# Patient Record
Sex: Female | Born: 1985 | Race: White | Hispanic: No | Marital: Single | Smoking: Never smoker
Health system: Southern US, Community
[De-identification: ages and names within clinical notes are randomized; demographics above are authoritative.]

## PROBLEM LIST (undated history)

## (undated) HISTORY — PX: TONSILLECTOMY: SUR1361

---

## 2010-02-02 ENCOUNTER — Encounter: Payer: Self-pay | Admitting: Family Medicine

## 2010-07-23 NOTE — Assessment & Plan Note (Signed)
Summary: BACK INJURY DUE TO FALL/TJ   Assessment PATIENT PROCEEDED TO ER  The patient and/or caregiver has been counseled thoroughly with regard to medications prescribed including dosage, schedule, interactions, rationale for use, and possible side effects and they verbalize understanding.  Diagnoses and expected course of recovery discussed and will return if not improved as expected or if the condition worsens. Patient and/or caregiver verbalized understanding.

## 2013-12-06 ENCOUNTER — Encounter: Payer: Self-pay | Admitting: Obstetrics & Gynecology

## 2013-12-06 DIAGNOSIS — Z01419 Encounter for gynecological examination (general) (routine) without abnormal findings: Secondary | ICD-10-CM

## 2014-02-21 ENCOUNTER — Encounter: Payer: Self-pay | Admitting: Obstetrics & Gynecology

## 2014-02-28 ENCOUNTER — Encounter: Payer: Self-pay | Admitting: Obstetrics & Gynecology

## 2014-02-28 DIAGNOSIS — Z01419 Encounter for gynecological examination (general) (routine) without abnormal findings: Secondary | ICD-10-CM

## 2014-04-10 ENCOUNTER — Encounter: Payer: Self-pay | Admitting: Obstetrics & Gynecology

## 2014-04-10 DIAGNOSIS — Z01419 Encounter for gynecological examination (general) (routine) without abnormal findings: Secondary | ICD-10-CM

## 2016-10-01 ENCOUNTER — Encounter (HOSPITAL_COMMUNITY): Payer: Self-pay

## 2016-10-01 ENCOUNTER — Emergency Department (HOSPITAL_COMMUNITY)
Admission: EM | Admit: 2016-10-01 | Discharge: 2016-10-01 | Disposition: A | Discharge status: Home / Self Care | Payer: Worker's Compensation | Attending: Emergency Medicine | Admitting: Emergency Medicine

## 2016-10-01 ENCOUNTER — Emergency Department (HOSPITAL_COMMUNITY): Payer: Worker's Compensation

## 2016-10-01 DIAGNOSIS — Y9389 Activity, other specified: Secondary | ICD-10-CM | POA: Insufficient documentation

## 2016-10-01 DIAGNOSIS — W1839XA Other fall on same level, initial encounter: Secondary | ICD-10-CM | POA: Insufficient documentation

## 2016-10-01 DIAGNOSIS — S6992XA Unspecified injury of left wrist, hand and finger(s), initial encounter: Secondary | ICD-10-CM

## 2016-10-01 DIAGNOSIS — Y9289 Other specified places as the place of occurrence of the external cause: Secondary | ICD-10-CM | POA: Insufficient documentation

## 2016-10-01 DIAGNOSIS — Y990 Civilian activity done for income or pay: Secondary | ICD-10-CM | POA: Insufficient documentation

## 2016-10-01 DIAGNOSIS — S60222A Contusion of left hand, initial encounter: Secondary | ICD-10-CM | POA: Insufficient documentation

## 2016-10-01 MED ORDER — OXYCODONE-ACETAMINOPHEN 5-325 MG PO TABS
1.0000 | ORAL_TABLET | Freq: Once | ORAL | Status: AC
  Administered 2016-10-01: 1 via ORAL
  Filled 2016-10-01: qty 1

## 2016-10-01 MED ORDER — OXYCODONE-ACETAMINOPHEN 5-325 MG PO TABS
ORAL_TABLET | ORAL | Status: AC
  Filled 2016-10-01: qty 1

## 2016-10-01 MED ORDER — IBUPROFEN 800 MG PO TABS: 800.0000 mg | ORAL_TABLET | Freq: Three times a day (TID) | ORAL | 0 refills | Status: AC

## 2016-10-01 NOTE — Progress Notes (Signed)
Orthopedic Tech Progress Note Patient Details:  Jillian Hickman 05-25-1986 [id number]  Ortho Devices Type of Ortho Device: Velcro wrist splint Ortho Device/Splint Location: lue Ortho Device/Splint Interventions: Ordered, Application   Trinna Post 10/01/2016, 11:24 PM

## 2016-10-01 NOTE — ED Notes (Signed)
Manny, Ortho tech at bedside applying velcro wrist splint

## 2016-10-01 NOTE — ED Provider Notes (Signed)
[facility]-EMERGENCY DEPT Provider Note    By signing my name below, I, Earmon Phoenix, attest that this documentation has been prepared under the direction and in the presence of [facility], PA-C. Electronically Signed: Earmon Phoenix, ED Scribe. 10/01/16. 10:45 PM.    History   Chief Complaint Chief Complaint  Patient presents with  . Fall   The history is provided by the patient and medical records. No language interpreter was used.    Jillian Hickman is an obese 30 y.o. female who presents to the Emergency Department complaining of a fall that occurred three hours ago while at work. She reports associated throbbing pain of the left hand that radiates to her left elbow. She reports some bruising, swelling and tingling of the left hand. Pt states her left hand feels cold intermittently. She states fell through a tailgate of a truck that was broken and fell on her outstretched left hand. She states her RLE was caught in between the tailgate and the truck. She has not taken anything PTA for pain but was given Percocet in triage that she states has helped ease the pain. Touching the areas, flexion and extension of the left wrist and walking increase the pain. She denies alleviating factors. She denies abdominal pain, diarrhea, nausea, vomiting, CP, SOB, back or neck pain, elbow pain, dizziness, HA, numbness or weakness of any extremity, head trauma or LOC.   History reviewed. No pertinent past medical history.  There are no active problems to display for this patient.   Past Surgical History:  Procedure Laterality Date  . TONSILLECTOMY      OB History    No data available       Home Medications    Prior to Admission medications   Medication Sig Start Date End Date Taking? Authorizing Provider  ibuprofen (ADVIL,MOTRIN) 800 MG tablet Take 1 tablet (800 mg total) by mouth 3 (three) times daily. 10/01/16 10/06/16  Jeanie Sewer, PA-C    Family History No family history on  file.  Social History Social History  Substance Use Topics  . Smoking status: Never Smoker  . Smokeless tobacco: Never Used  . Alcohol use No     Allergies   Morphine and related   Review of Systems Review of Systems  Constitutional: Negative for chills and fever.  Respiratory: Negative for shortness of breath.   Cardiovascular: Negative for chest pain.  Gastrointestinal: Negative for abdominal pain, diarrhea, nausea and vomiting.  Musculoskeletal: Positive for arthralgias, joint swelling and myalgias. Negative for back pain and neck pain.  Skin: Positive for color change. Negative for wound.  Neurological: Negative for dizziness, syncope, weakness, numbness and headaches.     Physical Exam Updated Vital Signs BP (!) 137/91 (BP Location: Right Arm)   Pulse 70   Temp 98.2 F (36.8 C) (Oral)   Resp 18   Ht  (1.651 m)   Wt 250 lb (113.4 kg)   SpO2 100%   BMI 41.60 kg/m   Physical Exam  Constitutional: She is oriented to person, place, and time. She appears well-developed and well-nourished.  HENT:  Head: Normocephalic and atraumatic.  Eyes: Conjunctivae are normal. Pupils are equal, round, and reactive to light. Right eye exhibits no discharge. Left eye exhibits no discharge. No scleral icterus.  Neck: Normal range of motion. Neck supple. No JVD present. No tracheal deviation present. No thyromegaly present.  No CSP ttp  Cardiovascular: Normal rate.   2+ radial, dp/pt pulses bl, good cap refill of  digits in left hand  Pulmonary/Chest: Effort normal.  Abdominal: She exhibits no distension.  Musculoskeletal: She exhibits edema and tenderness.  No midline spine ttp. 5/5 strength of BLE. Normal ROM of bl ankles. Exam of r hand and wrist normal. Exam of L hand/wrist limited due to pain. Pt will not flex/extend against resistance due to pain. There is swelling and ecchymosis to the left thenar eminence. No anatomic snuffbox tenderness. No crepitus palpated.    Neurological: She is alert and oriented to person, place, and time.  No facial droop, fluent speech, normal gait. Reported altered sensation of pins and needles to left digits and hand circumferentially.   Skin: Skin is warm and dry. Capillary refill takes less than 2 seconds.  Small lacerations to R shin, bleeding controlled. No erythema or drainage.   Psychiatric: She has a normal mood and affect. Her behavior is normal.  Nursing note and vitals reviewed.    ED Treatments / Results  DIAGNOSTIC STUDIES: Oxygen Saturation is 100% on RA, normal by my interpretation.   COORDINATION OF CARE: 9:35 PM- Will order imaging. Pt verbalizes understanding and agrees to plan.  Medications  oxyCODONE-acetaminophen (PERCOCET/ROXICET) 5-325 MG per tablet 1 tablet (1 tablet Oral Given 10/01/16 2257)   Labs (all labs ordered are listed, but only abnormal results are displayed) Labs Reviewed - No data to display  EKG  EKG Interpretation None       Radiology Dg Wrist Complete Left  Result Date: 10/01/2016 CLINICAL DATA:  Left wrist pain after fall EXAM: LEFT WRIST - COMPLETE 3+ VIEW COMPARISON:  None. FINDINGS: There is no evidence of fracture or dislocation. Carpal rows are maintained. Distal radius and ulna are unremarkable. There is no evidence of arthropathy or other focal bone abnormality. Soft tissues are unremarkable. IMPRESSION: No acute fracture no dislocations of the left wrist. Electronically Signed   By: Tollie Eth M.D.   On: 10/01/2016 22:25   Dg Hand Complete Left  Result Date: 10/01/2016 CLINICAL DATA:  Hand pain after fall. EXAM: LEFT HAND - COMPLETE 3+ VIEW COMPARISON:  None. FINDINGS: There is no evidence of fracture or dislocation. There is no evidence of arthropathy or other focal bone abnormality. Soft tissues are unremarkable. IMPRESSION: No acute osseous abnormality or dislocations. Electronically Signed   By: Tollie Eth M.D.   On: 10/01/2016 22:24     Procedures Procedures (including critical care time)  Medications Ordered in ED Medications  oxyCODONE-acetaminophen (PERCOCET/ROXICET) 5-325 MG per tablet 1 tablet (1 tablet Oral Given 10/01/16 2257)     Initial Impression / Assessment and Plan / ED Course  I have reviewed the triage vital signs and the nursing notes.  Pertinent labs & imaging results that were available during my care of the patient were reviewed by me and considered in my medical decision making (see chart for details).     30yof presents to ED with chief complaint left hand pain after injury at work. Pt afebrile, no active bleeding. ROM  And strength exam limited due to pain but tenderness and ecchymosis to thenar eminence on exam. No snuffbox tenderness. No crepitus appreciated. Patient X-Ray negative for obvious fracture or dislocation.  Low suspicion scaphoid or hamate hook fracture. Percocet helpful for pain. Pt advised to follow up with orthopedics within 1 week for re-evaluation. Patient given splint while in ED, conservative therapy recommended and discussed including RICE and NSAIDs. Per [place] controlled substance registry, pt receives medication for management of chronic pain and explained I will not  be prescribing narcotic pain medications. Patient will be discharged home & is agreeable with above plan. Returns precautions discussed. Pt appears safe for discharge.   Final Clinical Impressions(s) / ED Diagnoses   Final diagnoses:  Hand injury, left, initial encounter    New Prescriptions Discharge Medication List as of 10/01/2016 10:59 PM    START taking these medications   Details  ibuprofen (ADVIL,MOTRIN) 800 MG tablet Take 1 tablet (800 mg total) by mouth 3 (three) times daily., Starting Wed 10/01/2016, Until Mon 10/06/2016, Print        I personally performed the services described in this documentation, which was scribed in my presence. The recorded information has been reviewed and is  accurate.      Jeanie Sewer, PA-C 10/02/16 0159    Lavera Guise, MD 10/03/16 [phone]

## 2016-10-01 NOTE — ED Notes (Signed)
Ortho tech paged  

## 2016-10-01 NOTE — ED Triage Notes (Signed)
Pt presents to the ed for a fall, she was standing on the back of the truck, she did not realize it was broken so when she stood on it she fell onto the ground and the inside of her left leg got stuck on the tail gait. Complaints of pain in both legs and right hand. Right hand is swollen, cms intact

## 2017-11-09 ENCOUNTER — Encounter (HOSPITAL_COMMUNITY): Payer: Self-pay | Admitting: *Deleted

## 2017-11-09 ENCOUNTER — Emergency Department (HOSPITAL_COMMUNITY): Payer: Worker's Compensation

## 2017-11-09 ENCOUNTER — Other Ambulatory Visit: Payer: Self-pay

## 2017-11-09 ENCOUNTER — Emergency Department (HOSPITAL_COMMUNITY)
Admission: EM | Admit: 2017-11-09 | Discharge: 2017-11-09 | Disposition: A | Discharge status: Home / Self Care | Payer: Self-pay | Attending: Emergency Medicine | Admitting: Emergency Medicine

## 2017-11-09 DIAGNOSIS — W1789XA Other fall from one level to another, initial encounter: Secondary | ICD-10-CM | POA: Insufficient documentation

## 2017-11-09 DIAGNOSIS — S161XXA Strain of muscle, fascia and tendon at neck level, initial encounter: Secondary | ICD-10-CM | POA: Insufficient documentation

## 2017-11-09 DIAGNOSIS — R200 Anesthesia of skin: Secondary | ICD-10-CM | POA: Insufficient documentation

## 2017-11-09 DIAGNOSIS — M25512 Pain in left shoulder: Secondary | ICD-10-CM | POA: Insufficient documentation

## 2017-11-09 DIAGNOSIS — Y9389 Activity, other specified: Secondary | ICD-10-CM | POA: Insufficient documentation

## 2017-11-09 DIAGNOSIS — Y990 Civilian activity done for income or pay: Secondary | ICD-10-CM | POA: Insufficient documentation

## 2017-11-09 DIAGNOSIS — W19XXXA Unspecified fall, initial encounter: Secondary | ICD-10-CM

## 2017-11-09 DIAGNOSIS — Y9289 Other specified places as the place of occurrence of the external cause: Secondary | ICD-10-CM | POA: Insufficient documentation

## 2017-11-09 MED ORDER — KETOROLAC TROMETHAMINE 15 MG/ML IJ SOLN
15.0000 mg | Freq: Once | INTRAMUSCULAR | Status: AC
  Administered 2017-11-09: 15 mg via INTRAMUSCULAR
  Filled 2017-11-09: qty 1

## 2017-11-09 MED ORDER — CYCLOBENZAPRINE HCL 10 MG PO TABS: 10.0000 mg | ORAL_TABLET | Freq: Two times a day (BID) | ORAL | 0 refills | Status: AC | PRN

## 2017-11-09 MED ORDER — NAPROXEN 500 MG PO TABS: 500.0000 mg | ORAL_TABLET | Freq: Two times a day (BID) | ORAL | 0 refills | Status: AC | PRN

## 2017-11-09 NOTE — ED Notes (Signed)
Pt verbalized understanding discharge instructions and denies any further needs or questions at this time. VS stable, ambulatory and steady gait.   

## 2017-11-09 NOTE — ED Triage Notes (Signed)
Pt reports falling at work on Friday, landed on left side and hit head. No loc. Pt has pain to neck and left shoulder/arm.

## 2017-11-09 NOTE — ED Notes (Signed)
Patient transported to CT 

## 2017-11-09 NOTE — Discharge Instructions (Signed)
It was my pleasure taking care of you today!   Fortunately, your imaging was reassuring today.   Flexeril if your muscle relaxer to take as needed. Naproxen as needed for pain. In addition to this, you can alternate with Tylenol for further pain relief.   Please call the orthopedic doctor to schedule a follow up appointment.  Return to ER for new or worsening symptoms, any additional concerns .

## 2017-11-09 NOTE — ED Provider Notes (Signed)
[facility] EMERGENCY DEPARTMENT Provider Note   CSN: [id number] Arrival date & time: 11/09/17  1719     History   Chief Complaint Chief Complaint  Patient presents with  . Fall    HPI [place] Waidelich is a 32 y.o. female.  The history is provided by the patient and medical records. No language interpreter was used.  Fall    Thandiwe Siragusa is a 32 y.o. female  with no known PMH who presents to the Emergency Department complaining of persistent neck and left shoulder pain since a fall 4 days ago. Patient works for [employer] and was stepping out of the truck backwards when she fell onto her upper back and left shoulder area. She did hit head. No LOC. She has been experiencing left-sided neck pain and shoulder pain as well as numbness down the left arm. She reports numbness to the entire hand and anterior aspect of the arm. She has been alternating between Tylenol and Ibuprofen for pain with a little relief. She thought her symptoms would improve, but after doing this and resting for a few days, there has been no improvement.  History reviewed. No pertinent past medical history.  There are no active problems to display for this patient.   Past Surgical History:  Procedure Laterality Date  . TONSILLECTOMY       OB History   None      Home Medications    Prior to Admission medications   Medication Sig Start Date End Date Taking? Authorizing Provider  cyclobenzaprine (FLEXERIL) 10 MG tablet Take 1 tablet (10 mg total) by mouth 2 (two) times daily as needed (muscle soreness). 11/09/17   Deneice Wack, Chase Picket, PA-C  naproxen (NAPROSYN) 500 MG tablet Take 1 tablet (500 mg total) by mouth 2 (two) times daily as needed. 11/09/17   Omid Deardorff, Chase Picket, PA-C    Family History History reviewed. No pertinent family history.  Social History Social History   Tobacco Use  . Smoking status: Never Smoker  . Smokeless tobacco: Never Used  Substance Use Topics  .  Alcohol use: No  . Drug use: Not on file     Allergies   Morphine and related   Review of Systems Review of Systems  Musculoskeletal: Positive for arthralgias, myalgias and neck pain.  Neurological: Positive for numbness. Negative for weakness.     Physical Exam Updated Vital Signs BP 116/83 (BP Location: Right Arm)   Pulse 80   Temp 98.1 F (36.7 C) (Oral)   Resp 14   Ht  (1.651 m)   Wt 99.8 kg (220 lb)   LMP 10/12/2017   SpO2 100%   BMI 36.61 kg/m   Physical Exam  Constitutional: She appears well-developed and well-nourished. No distress.  HENT:  Head: Normocephalic and atraumatic.  Neck: Neck supple.  + midline and left sided neck tenderness. Full ROM.  Cardiovascular: Normal rate, regular rhythm and normal heart sounds.  No murmur heard. Pulmonary/Chest: Effort normal and breath sounds normal. No respiratory distress. She has no wheezes. She has no rales.  Musculoskeletal:  Tenderness to palpation to left trapezius and posterior shoulder. Full ROM and 5/5 muscle strength to LUE. All compartments are soft. 2+ radial pulse. Good cap refill. She does have subjective diminished sensation to radial aspect of the hand and distal forearm when compared to left. Equal sensation to ulnar aspect of the arm and axillary nerve distribution.   Neurological: She is alert.  Skin: Skin is warm and  dry.  Nursing note and vitals reviewed.    ED Treatments / Results  Labs (all labs ordered are listed, but only abnormal results are displayed) Labs Reviewed - No data to display  EKG None  Radiology Ct Cervical Spine Wo Contrast  Result Date: 11/09/2017 CLINICAL DATA:  Patient fell backwards onto left side when hit by items at work. Patient fell onto pavement complaints of neck pain and numbness of the left shoulder and arm. EXAM: CT CERVICAL SPINE WITHOUT CONTRAST TECHNIQUE: Multidetector CT imaging of the cervical spine was performed without intravenous contrast.  Multiplanar CT image reconstructions were also generated. COMPARISON:  None. FINDINGS: Alignment: Reversal of cervical lordosis which may be due to muscle spasm or patient positioning. Intact craniocervical relationship. Intact atlantodental interval minimal grade 1 anterolisthesis of C2 on C3 and C3 on C4 likely positional. Skull base and vertebrae: No acute fracture. No primary bone lesion or focal pathologic process. Soft tissues and spinal canal: No prevertebral fluid or swelling. No visible canal hematoma. Disc levels:  No disc herniation, central or foraminal stenosis. Upper chest: Clear lung apices. Other: None IMPRESSION: No acute cervical spine fracture. Slight reversal of cervical lordosis with minimal grade 1 anterolisthesis of C2 on C3 and C3 on C4 likely due to patient positioning or muscle spasm. Electronically Signed   By: Tollie Eth M.D.   On: 11/09/2017 20:16   Dg Shoulder Left  Result Date: 11/09/2017 CLINICAL DATA:  Patient fell at work landing on left side. Patient presents with numbness in the left arm. EXAM: LEFT SHOULDER - 2+ VIEW COMPARISON:  None. FINDINGS: There is no evidence of fracture or dislocation. There is no evidence of arthropathy or other focal bone abnormality. Soft tissues are unremarkable. IMPRESSION: Negative. Electronically Signed   By: Tollie Eth M.D.   On: 11/09/2017 20:17    Procedures Procedures (including critical care time)  Medications Ordered in ED Medications  ketorolac (TORADOL) 15 MG/ML injection 15 mg (15 mg Intramuscular Given 11/09/17 2050)     Initial Impression / Assessment and Plan / ED Course  I have reviewed the triage vital signs and the nursing notes.  Pertinent labs & imaging results that were available during my care of the patient were reviewed by me and considered in my medical decision making (see chart for details).    Yumalay Circle is a 32 y.o. female who presents to ED for evaluation after fall 4 days ago. Still having  persistent neck and shoulder pain as well as left arm numbness. On exam, NVI. She does have midline neck tenderness - full ROM. Tenderness to the shoulder as well. She does have subjective diminished sensation to radial aspect of her right distal upper extremity when compared to left. X-ray of shoulder negative. CT of the neck obtained without signs of fracture.  Slight reversal of cervical lordosis noted positional versus muscle spasm.  Will treat symptomatically with muscle relaxer and NSAIDs.  Orthopedic follow-up provided.  Reasons to return to the emergency department discussed and all questions answered.  Final Clinical Impressions(s) / ED Diagnoses   Final diagnoses:  Fall, initial encounter  Acute pain of left shoulder  Strain of neck muscle, initial encounter  Numbness    ED Discharge Orders        Ordered    naproxen (NAPROSYN) 500 MG tablet  2 times daily PRN     11/09/17 2059    cyclobenzaprine (FLEXERIL) 10 MG tablet  2 times daily PRN  11/09/17 2059       Cheyna Retana, Chase Picket, PA-C 11/09/17 2115    Donnetta Hutching, MD 11/12/17 [phone]

## 2019-12-18 IMAGING — CT CT CERVICAL SPINE W/O CM
3 of 4 series · 13 of 33 positions shown, 16 images · non-contrast
Comparison: None.

CLINICAL DATA: Patient fell backwards onto left side when hit by
items at work. Patient fell onto pavement complaints of neck pain
and numbness of the left shoulder and arm.

EXAM:
CT CERVICAL SPINE WITHOUT CONTRAST
TECHNIQUE: Multidetector CT imaging of the cervical spine was performed without
intravenous contrast. Multiplanar CT image reconstructions were also
generated.

[Series 5: sag bone · sagittal · 0.26mm/px · 5 of 61 slices shown, 6 images]
[im 21/61  bone]
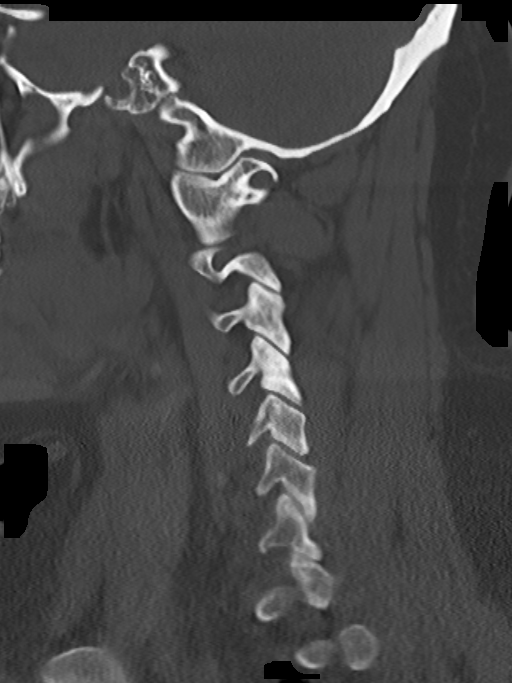
[im 26/61  bone]
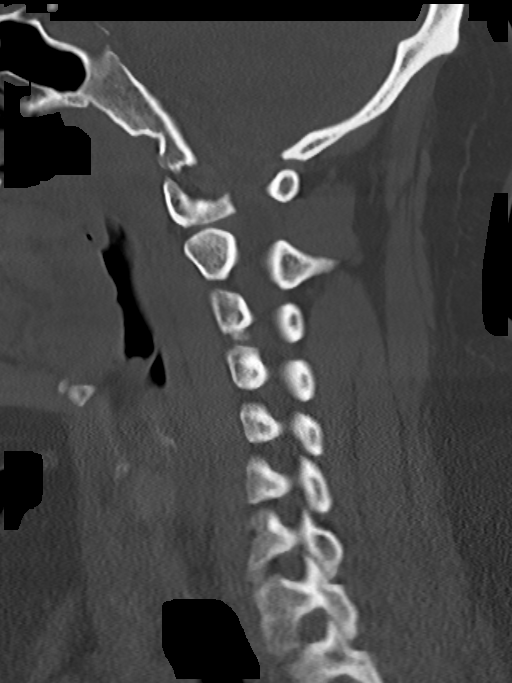
[im 31/61  soft-tissue]
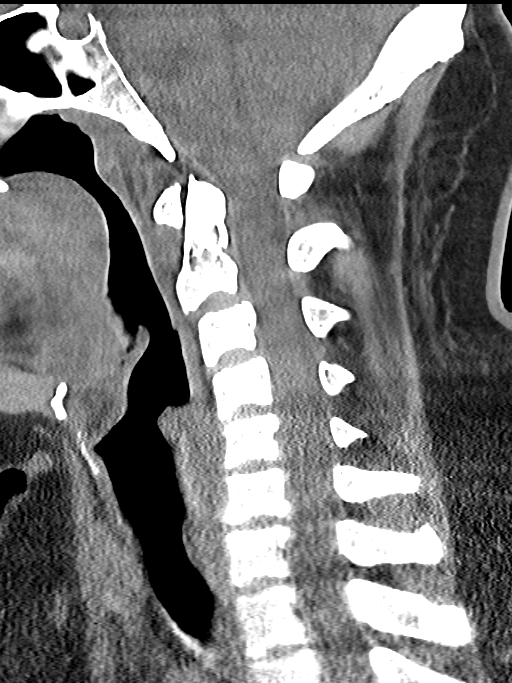
[im 31/61  bone]
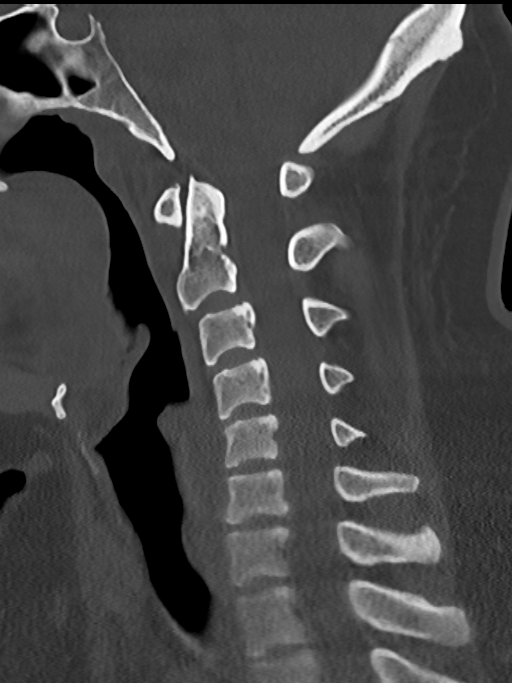
[im 36/61  bone]
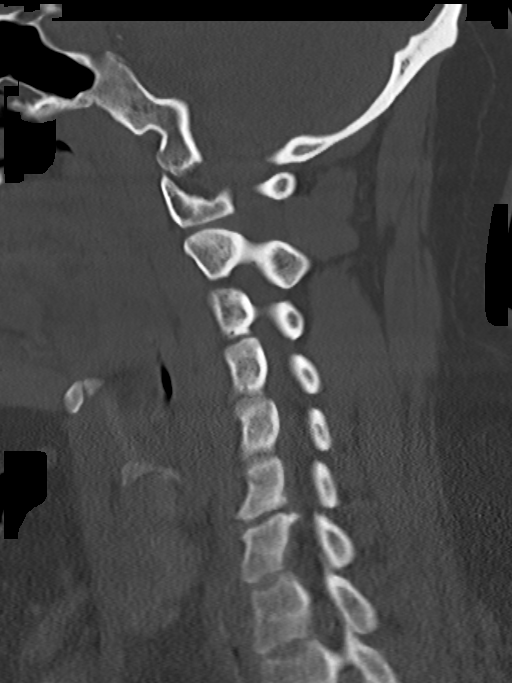
[im 41/61  bone]
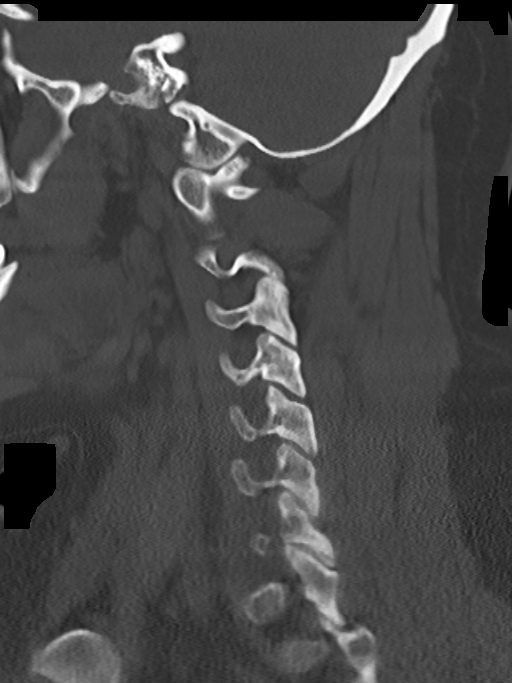

[Series 6: cor bone · coronal · 0.26mm/px · 3 of 61 slices shown]
[im 13/61  bone]
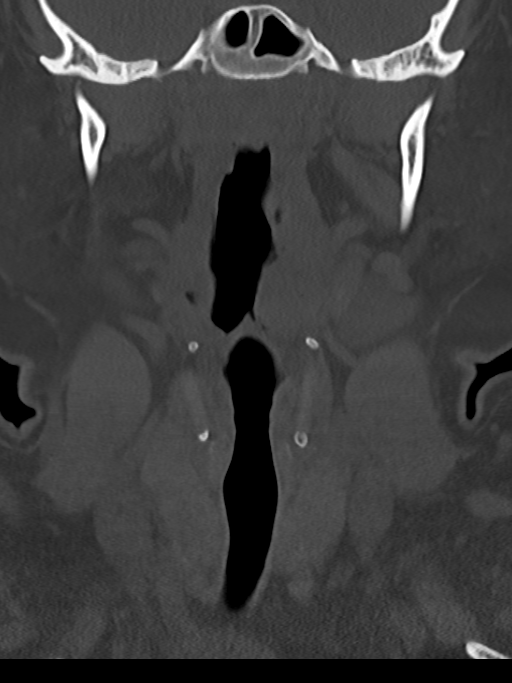
[im 25/61  bone]
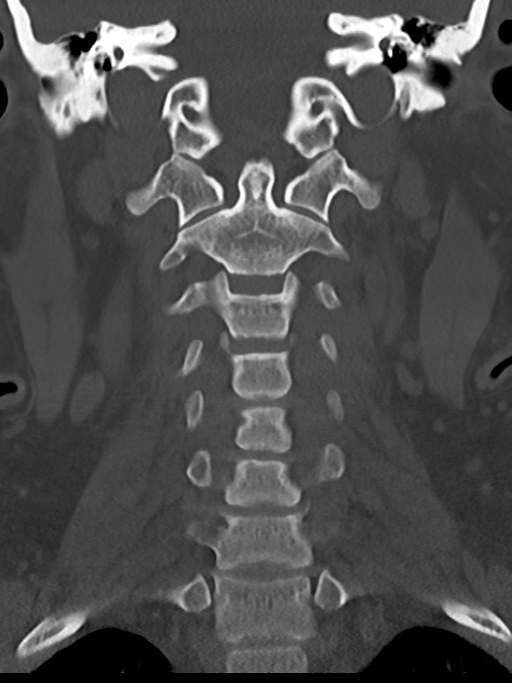
[im 37/61  bone]
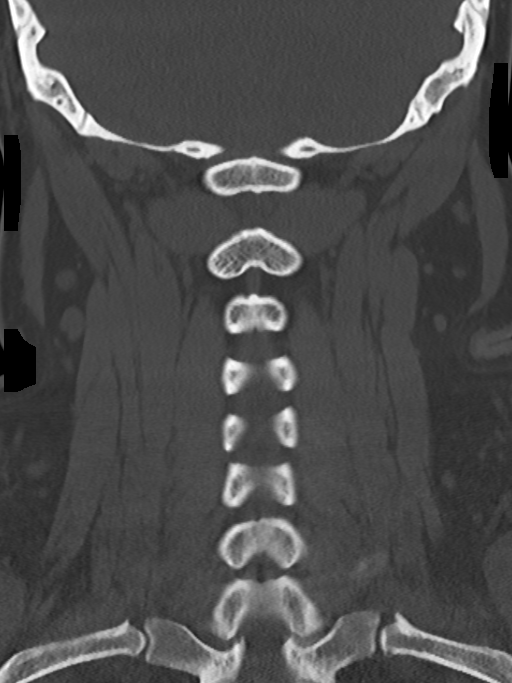

[Series 8: orthogonal axials · axial · 0.21mm/px · z∈[+1127,+1223]mm · 5 of 84 slices shown, 7 images]
[im 14/84  soft-tissue]
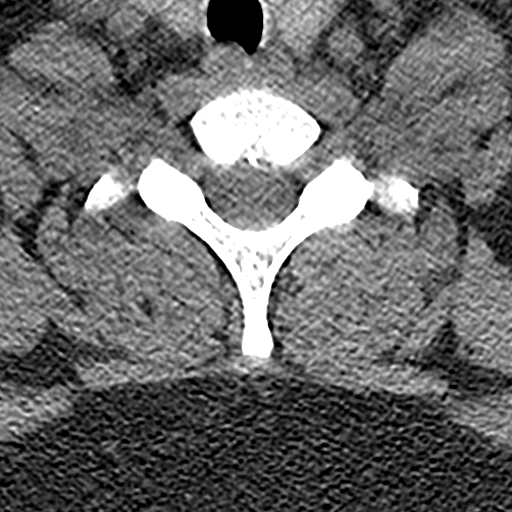
[im 14/84  bone]
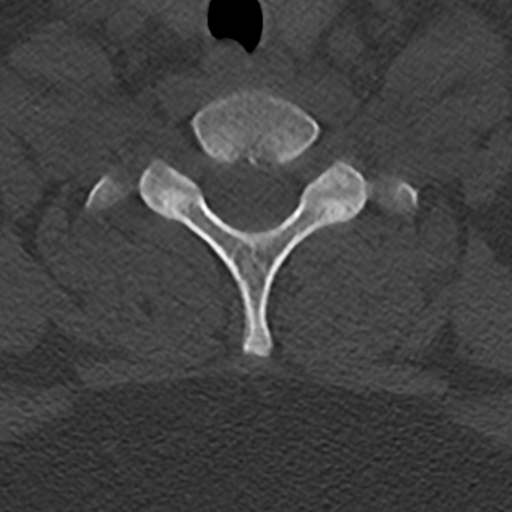
[im 28/84  bone]
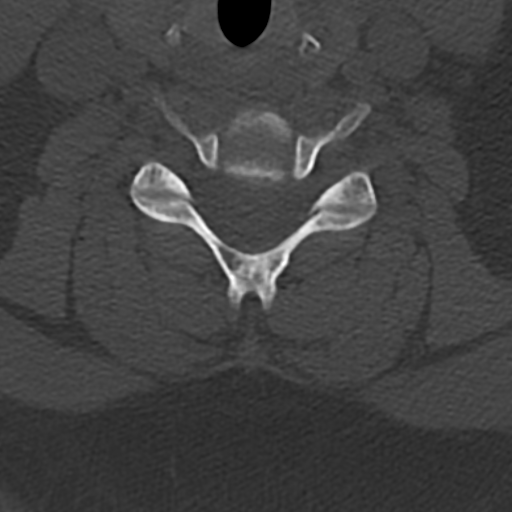
[im 42/84  bone]
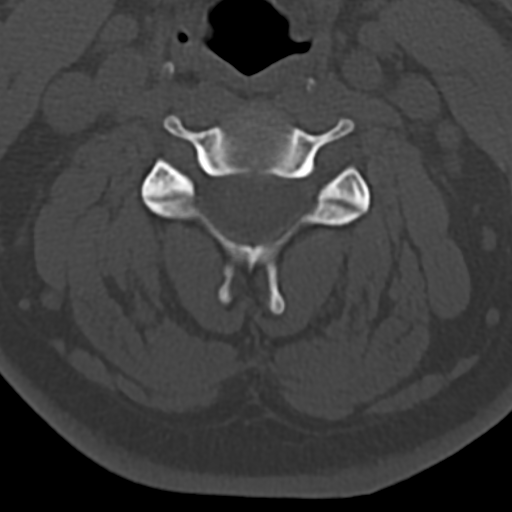
[im 56/84  bone]
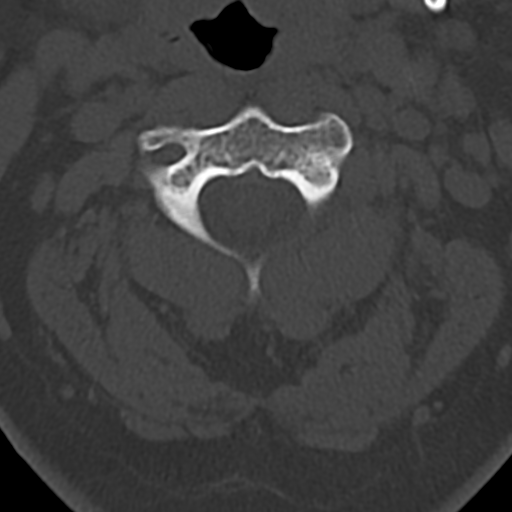
[im 70/84  soft-tissue]
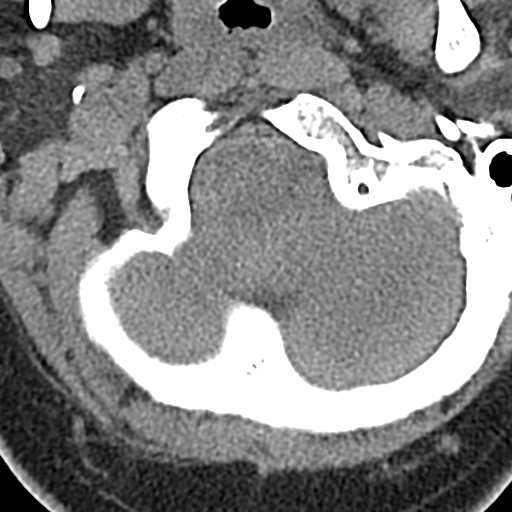
[im 70/84  bone]
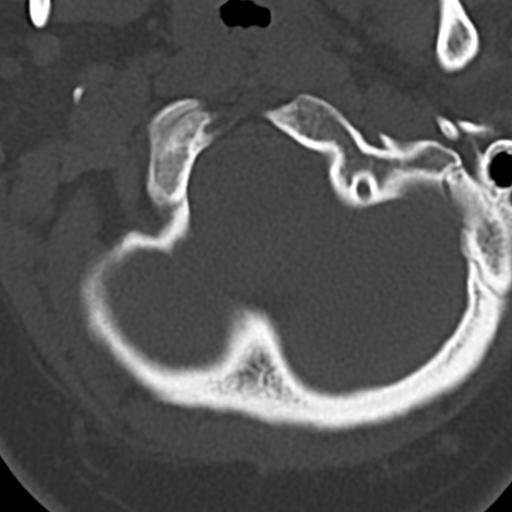

[13 of 33 positions shown; findings below may reference images not displayed]

FINDINGS: Alignment: Reversal of cervical lordosis which may be due to muscle
spasm or patient positioning. Intact craniocervical relationship.
Intact atlantodental interval minimal grade 1 anterolisthesis of C2
on C3 and C3 on C4 likely positional.

Skull base and vertebrae: No acute fracture. No primary bone lesion
or focal pathologic process.

Soft tissues and spinal canal: No prevertebral fluid or swelling. No
visible canal hematoma.

Disc levels:  No disc herniation, central or foraminal stenosis.

Upper chest: Clear lung apices.

Other: None
IMPRESSION: No acute cervical spine fracture. Slight reversal of cervical
lordosis with minimal grade 1 anterolisthesis of C2 on C3 and C3 on
C4 likely due to patient positioning or muscle spasm.
